# Patient Record
Sex: Male | Born: 1990
Health system: Southern US, Community
[De-identification: ages and names within clinical notes are randomized; demographics above are authoritative.]

## PROBLEM LIST (undated history)

## (undated) DIAGNOSIS — R51 Headache: Secondary | ICD-10-CM

## (undated) DIAGNOSIS — S069X9A Unspecified intracranial injury with loss of consciousness of unspecified duration, initial encounter: Secondary | ICD-10-CM

## (undated) DIAGNOSIS — S065X9A Traumatic subdural hemorrhage with loss of consciousness of unspecified duration, initial encounter: Secondary | ICD-10-CM

## (undated) DIAGNOSIS — S27329A Contusion of lung, unspecified, initial encounter: Secondary | ICD-10-CM

## (undated) HISTORY — DX: Contusion of lung, unspecified, initial encounter: S27.329A

## (undated) HISTORY — DX: Unspecified intracranial injury with loss of consciousness of unspecified duration, initial encounter: S06.9X9A

## (undated) HISTORY — DX: Traumatic subdural hemorrhage with loss of consciousness of unspecified duration, initial encounter: S06.5X9A

---
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
## 2011-04-09 LAB — BASIC METABOLIC PANEL
BUN: 13 mg/dL (ref 6–23)
Calcium: 10.4 mg/dL (ref 8.4–10.5)
Chloride: 102 mEq/L (ref 96–112)
Glucose, Bld: 95 mg/dL (ref 70–99)
Potassium: 4.7 mEq/L (ref 3.5–5.1)
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
## 2013-07-24 ENCOUNTER — Emergency Department (HOSPITAL_COMMUNITY)
Admission: EM | Admit: 2013-07-24 | Discharge: 2013-07-24 | Disposition: A | Discharge status: Home / Self Care | Payer: Self-pay | Attending: Emergency Medicine | Admitting: Emergency Medicine

## 2013-07-24 ENCOUNTER — Encounter (HOSPITAL_COMMUNITY): Payer: Self-pay | Admitting: Emergency Medicine

## 2013-07-24 DIAGNOSIS — K4090 Unilateral inguinal hernia, without obstruction or gangrene, not specified as recurrent: Secondary | ICD-10-CM | POA: Insufficient documentation

## 2013-07-24 DIAGNOSIS — F17200 Nicotine dependence, unspecified, uncomplicated: Secondary | ICD-10-CM | POA: Insufficient documentation

## 2013-07-24 NOTE — ED Provider Notes (Signed)
  CSN: [id number]     Arrival date & time 07/24/13  1311 History     First MD Initiated Contact with Patient 07/24/13 1451     Chief Complaint  Patient presents with  . Groin Pain   (Consider location/radiation/quality/duration/timing/severity/associated sxs/prior Treatment) Patient is a 22 y.o. male presenting with groin pain. The history is provided by the patient. No language interpreter was used.  Groin Pain This is a new problem. The current episode started 1 to 4 weeks ago. Associated symptoms include abdominal pain. Pertinent negatives include no chills, fever, nausea or vomiting. Associated symptoms comments: Swelling in left groin that is intermittent, usually associated with heavy lifting. It has been going on for the  last month. No dysuria, hematura, scrotal or testicular pain or swelling. Marland Kitchen    History reviewed. No pertinent past medical history. History reviewed. No pertinent past surgical history. History reviewed. No pertinent family history. History  Substance Use Topics  . Smoking status: Current Some Day Smoker  . Smokeless tobacco: Not on file  . Alcohol Use: Yes     Comment: occasion    Review of Systems  Constitutional: Negative for fever and chills.  Gastrointestinal: Positive for abdominal pain. Negative for nausea and vomiting.  Genitourinary: Negative for dysuria, discharge, scrotal swelling, difficulty urinating and testicular pain.  Musculoskeletal: Negative.   Skin: Negative.   Neurological: Negative.     Allergies  Review of patient's allergies indicates no known allergies.  Home Medications  No current outpatient prescriptions on file. BP 132/84  Pulse 62  Temp(Src) 98.4 F (36.9 C) (Oral)  Resp 18  SpO2 100% Physical Exam  Constitutional: He is oriented to person, place, and time. He appears well-developed and well-nourished.  HENT:  Head: Normocephalic.  Pulmonary/Chest: Effort normal.  Abdominal: Soft. There is no tenderness. There is  no rebound and no guarding.  Genitourinary:  Non-tender testicles, no scrotal swelling. Mass produced with intra-abdominal pressure in left inguinal area c/w hernia. Easily reducible. Minimally tender.   Neurological: He is alert and oriented to person, place, and time.  Skin: Skin is warm and dry.  Psychiatric: He has a normal mood and affect.    ED Course   Procedures (including critical care time)  Labs Reviewed - No data to display No results found. No diagnosis found. 1. Left inguinal hernia. MDM  Exam findings c/w easily reducible inguinal hernia. Will refer to surgery for elective repair. Discussed signs and symptoms that would prompt return to ED.  Arnoldo Hooker, PA-C 07/24/13 [phone]

## 2013-07-24 NOTE — ED Notes (Signed)
Patient rates pain in L Groin as 0/10 without movement.

## 2013-07-24 NOTE — ED Notes (Signed)
Pt reports pain in left groin for the last month or so. States was previously told he has a hernia in the same area. Pt reports in the last 2 weeks pain has increased. Pt denies nausea/vomiting. Reports some diarrhea. Pt alert, oriented x4, NAD at present.

## 2013-07-24 NOTE — ED Provider Notes (Signed)
Medical screening examination/treatment/procedure(s) were performed by non-physician practitioner and as supervising physician I was immediately available for consultation/collaboration.   Iori Gigante M Anevay Campanella, MD 07/24/13 1857 

## 2013-08-03 ENCOUNTER — Ambulatory Visit (INDEPENDENT_AMBULATORY_CARE_PROVIDER_SITE_OTHER): Payer: Self-pay | Admitting: Surgery

## 2013-08-17 ENCOUNTER — Telehealth (INDEPENDENT_AMBULATORY_CARE_PROVIDER_SITE_OTHER): Payer: Self-pay | Admitting: Surgery

## 2013-08-17 ENCOUNTER — Ambulatory Visit (INDEPENDENT_AMBULATORY_CARE_PROVIDER_SITE_OTHER): Payer: No Typology Code available for payment source | Admitting: Surgery

## 2013-08-17 ENCOUNTER — Encounter (INDEPENDENT_AMBULATORY_CARE_PROVIDER_SITE_OTHER): Payer: Self-pay | Admitting: Surgery

## 2013-08-17 VITALS — BP 116/66 | HR 64 | Temp 98.0°F | Resp 14 | Ht 71.0 in | Wt 188.0 lb

## 2013-08-17 DIAGNOSIS — K4090 Unilateral inguinal hernia, without obstruction or gangrene, not specified as recurrent: Secondary | ICD-10-CM

## 2013-08-17 DIAGNOSIS — K4020 Bilateral inguinal hernia, without obstruction or gangrene, not specified as recurrent: Secondary | ICD-10-CM | POA: Insufficient documentation

## 2013-08-17 NOTE — Telephone Encounter (Signed)
Pt made aware of financial obligation and orders active for 90 days. His insurance is monthly policy  Will call when able to  schedule

## 2013-08-17 NOTE — Patient Instructions (Addendum)
See the Handout(s) we gave you.  Consider surgery.  Please call our office at [phone] if you wish to schedule surgery or if you have further questions / concerns.   Hernia A hernia occurs when an internal organ pushes out through a weak spot in the abdominal wall. Hernias most commonly occur in the groin and around the navel. Hernias often can be pushed back into place (reduced). Most hernias tend to get worse over time. Some abdominal hernias can get stuck in the opening (irreducible or incarcerated hernia) and cannot be reduced. An irreducible abdominal hernia which is tightly squeezed into the opening is at risk for impaired blood supply (strangulated hernia). A strangulated hernia is a medical emergency. Because of the risk for an irreducible or strangulated hernia, surgery may be recommended to repair a hernia. CAUSES   Heavy lifting.  Prolonged coughing.  Straining to have a bowel movement.  A cut (incision) made during an abdominal surgery. HOME CARE INSTRUCTIONS   Bed rest is not required. You may continue your normal activities.  Avoid lifting more than 10 pounds (4.5 kg) or straining.  Cough gently. If you are a smoker it is best to stop. Even the best hernia repair can break down with the continual strain of coughing. Even if you do not have your hernia repaired, a cough will continue to aggravate the problem.  Do not wear anything tight over your hernia. Do not try to keep it in with an outside bandage or truss. These can damage abdominal contents if they are trapped within the hernia sac.  Eat a normal diet.  Avoid constipation. Straining over long periods of time will increase hernia size and encourage breakdown of repairs. If you cannot do this with diet alone, stool softeners may be used. SEEK IMMEDIATE MEDICAL CARE IF:   You have a fever.  You develop increasing abdominal pain.  You feel nauseous or vomit.  Your hernia is stuck outside the abdomen, looks  discolored, feels hard, or is tender.  You have any changes in your bowel habits or in the hernia that are unusual for you.  You have increased pain or swelling around the hernia.  You cannot push the hernia back in place by applying gentle pressure while lying down. MAKE SURE YOU:   Understand these instructions.  Will watch your condition.  Will get help right away if you are not doing well or get worse. Document Released: 12/10/2005 Document Revised: 03/03/2012 Document Reviewed: 07/29/2008 Exit[employer] Patient Information 2014 [employer].  HERNIA REPAIR: POST OP INSTRUCTIONS  1. DIET: Follow a light bland diet the first 24 hours after arrival home, such as soup, liquids, crackers, etc.  Be sure to include lots of fluids daily.  Avoid fast food or heavy meals as your are more likely to get nauseated.  Eat a low fat the next few days after surgery. 2. Take your usually prescribed home medications unless otherwise directed. 3. PAIN CONTROL: a. Pain is best controlled by a usual combination of three different methods TOGETHER: i. Ice/Heat ii. Over the counter pain medication iii. Prescription pain medication b. Most patients will experience some swelling and bruising around the hernia(s) such as the bellybutton, groins, or old incisions.  Ice packs or heating pads (30-60 minutes up to 6 times a day) will help. Use ice for the first few days to help decrease swelling and bruising, then switch to heat to help relax tight/sore spots and speed recovery.  Some people prefer to   use ice alone, heat alone, alternating between ice & heat.  Experiment to what works for you.  Swelling and bruising can take several weeks to resolve.   c. It is helpful to take an over-the-counter pain medication regularly for the first few weeks.  Choose one of the following that works best for you: i. Naproxen (Aleve, etc)  Two 220mg tabs twice a day ii. Ibuprofen (Advil, etc) Three 200mg tabs four times a day  (every meal & bedtime) iii. Acetaminophen (Tylenol, etc) 325-650mg four times a day (every meal & bedtime) d. A  prescription for pain medication should be given to you upon discharge.  Take your pain medication as prescribed.  i. If you are having problems/concerns with the prescription medicine (does not control pain, nausea, vomiting, rash, itching, etc), please call us [phone] to see if we need to switch you to a different pain medicine that will work better for you and/or control your side effect better. ii. If you need a refill on your pain medication, please contact your pharmacy.  They will contact our office to request authorization. Prescriptions will not be filled after 5 pm or on week-ends. 4. Avoid getting constipated.  Between the surgery and the pain medications, it is common to experience some constipation.  Increasing fluid intake and taking a fiber supplement (such as Metamucil, Citrucel, FiberCon, MiraLax, etc) 1-2 times a day regularly will usually help prevent this problem from occurring.  A mild laxative (prune juice, Milk of Magnesia, MiraLax, etc) should be taken according to package directions if there are no bowel movements after 48 hours.   5. Wash / shower every day.  You may shower over the dressings as they are waterproof.   6. Remove your waterproof bandages 5 days after surgery.  You may leave the incision open to air.  You may replace a dressing/Band-Aid to cover the incision for comfort if you wish.  Continue to shower over incision(s) after the dressing is off.    7. ACTIVITIES as tolerated:   a. You may resume regular (light) daily activities beginning the next day-such as daily self-care, walking, climbing stairs-gradually increasing activities as tolerated.  If you can walk 30 minutes without difficulty, it is safe to try more intense activity such as jogging, treadmill, bicycling, low-impact aerobics, swimming, etc. b. Save the most intensive and strenuous  activity for last such as sit-ups, heavy lifting, contact sports, etc  Refrain from any heavy lifting or straining until you are off narcotics for pain control.   c. DO NOT PUSH THROUGH PAIN.  Let pain be your guide: If it hurts to do something, don't do it.  Pain is your body warning you to avoid that activity for another week until the pain goes down. d. You may drive when you are no longer taking prescription pain medication, you can comfortably wear a seatbelt, and you can safely maneuver your car and apply brakes. e. You may have sexual intercourse when it is comfortable.  8. FOLLOW UP in our office a. Please call [facility] at [phone] to set up an appointment to see your surgeon in the office for a follow-up appointment approximately 2-3 weeks after your surgery. b. Make sure that you call for this appointment the day you arrive home to insure a convenient appointment time. 9.  IF YOU HAVE DISABILITY OR FAMILY LEAVE FORMS, BRING THEM TO THE OFFICE FOR PROCESSING.  DO NOT GIVE THEM TO YOUR DOCTOR.  WHEN TO CALL US (  [phone]: 1. Poor pain control 2. Reactions / problems with new medications (rash/itching, nausea, etc)  3. Fever over 101.5 F (38.5 C) 4. Inability to urinate 5. Nausea and/or vomiting 6. Worsening swelling or bruising 7. Continued bleeding from incision. 8. Increased pain, redness, or drainage from the incision   The clinic staff is available to answer your questions during regular business hours (8:30am-5pm).  Please don't hesitate to call and ask to speak to one of our nurses for clinical concerns.   If you have a medical emergency, go to the nearest emergency room or call 911.  A surgeon from [facility] is always on call at the hospitals in [place]  [facility] [address], [address], [place], [place]  [address] ?  [address] [address], [place], [place]   [address] MAIN: [phone] ? TOLL FREE: [phone] ? FAX: [phone] [email or web address]  

## 2013-08-17 NOTE — Progress Notes (Signed)
Subjective:     Patient ID: Shawn Mack, male   DOB: 1991/08/22, 22 y.o.   MRN: [id number]  HPI  Shawn Mack  05-Nov-1991 [id number]  Patient Care Team: No Pcp Per Patient as PCP - General (General Practice)  This patient is a 22 y.o.male who presents today for surgical evaluation at the request of Dr. Evern Bio ED MD.   Reason for visit: Left inguinal hernia  Pleasant young male.  Works as an Personnel officer.  Moderate activity with occasional lifting.  He has had intermittent lower abdominal groin pains for some time.  Workup has been negative and urgent Center and emergency room.  Became more focal the left groin.  Evaluation in emergency room earlier this month suspicious for inguinal hernia.  Therefore, sent to me for surgical evaluation.  Patient is very active.  He does not smoke.  No prior surgeries.  Has a daily bowel movement.  No history of skin infections.  The groin pain is worse with prolonged sitting or standing.  Also with increased activity.  He feels it affects his ability to work as he often has to decrease his activity level to avoid setting it off.  He is interested in fixing the hernia  Patient Active Problem List   Diagnosis Date Noted  . Inguinal hernia, left 08/17/2013    History reviewed. No pertinent past medical history.  History reviewed. No pertinent past surgical history.  History   Social History  . Marital Status: Unknown    Spouse Name: N/A    Number of Children: N/A  . Years of Education: N/A   Occupational History  . Not on file.   Social History Main Topics  . Smoking status: Current Every Day Smoker -- 0.25 packs/day  . Smokeless tobacco: Never Used  . Alcohol Use: Yes     Comment: occasion  . Drug Use: Yes    Special: Marijuana  . Sexual Activity: No   Other Topics Concern  . Not on file   Social History Narrative  . No narrative on file    History reviewed. No pertinent family history.  No current  outpatient prescriptions on file.   No current facility-administered medications for this visit.     No Known Allergies  BP 116/66  Pulse 64  Temp(Src) 98 F (36.7 C) (Temporal)  Resp 14  Ht 5\' 11"  (1.803 m)  Wt 188 lb (85.276 kg)  BMI 26.23 kg/m2  No results found.   Review of Systems  Constitutional: Negative for fever, chills and diaphoresis.  HENT: Negative for nosebleeds, sore throat, facial swelling, mouth sores, trouble swallowing and ear discharge.   Eyes: Negative for photophobia, discharge and visual disturbance.  Respiratory: Negative for choking, chest tightness, shortness of breath and stridor.   Cardiovascular: Negative for chest pain and palpitations.  Gastrointestinal: Negative for nausea, vomiting, abdominal pain, diarrhea, constipation, blood in stool, abdominal distention, anal bleeding and rectal pain.  Endocrine: Negative for cold intolerance and heat intolerance.  Genitourinary: Negative for dysuria, urgency, difficulty urinating and testicular pain.  Musculoskeletal: Positive for myalgias. Negative for back pain, arthralgias and gait problem.  Skin: Negative for color change, pallor, rash and wound.  Allergic/Immunologic: Negative for environmental allergies and food allergies.  Neurological: Negative for dizziness, speech difficulty, weakness, numbness and headaches.  Hematological: Negative for adenopathy. Does not bruise/bleed easily.  Psychiatric/Behavioral: Negative for hallucinations, confusion and agitation.       Objective:   Physical Exam  Constitutional: He is oriented  to person, place, and time. He appears well-developed and well-nourished. No distress.  HENT:  Head: Normocephalic.  Mouth/Throat: Oropharynx is clear and moist. No oropharyngeal exudate.  Eyes: Conjunctivae and EOM are normal. Pupils are equal, round, and reactive to light. No scleral icterus.  Neck: Normal range of motion. Neck supple. No tracheal deviation present.    Cardiovascular: Normal rate, regular rhythm and intact distal pulses.   Pulmonary/Chest: Effort normal and breath sounds normal. No respiratory distress.  Abdominal: Soft. He exhibits no distension. There is no tenderness. A hernia is present. Hernia confirmed positive in the left inguinal area. Hernia confirmed negative in the ventral area and confirmed negative in the right inguinal area.    Musculoskeletal: Normal range of motion. He exhibits no tenderness.  Lymphadenopathy:    He has no cervical adenopathy.       Right: No inguinal adenopathy present.       Left: No inguinal adenopathy present.  Neurological: He is alert and oriented to person, place, and time. No cranial nerve deficit. He exhibits normal muscle tone. Coordination normal.  Skin: Skin is warm and dry. No rash noted. He is not diaphoretic. No erythema. No pallor.  Psychiatric: He has a normal mood and affect. His behavior is normal. Judgment and thought content normal.       Assessment:     Left inguinal hernia in an active male    Plan:     Lap repair:  The anatomy & physiology of the abdominal wall and pelvic floor was discussed.  The pathophysiology of hernias in the inguinal and pelvic region was discussed.  Natural history risks such as progressive enlargement, pain, incarceration & strangulation was discussed.   Contributors to complications such as smoking, obesity, diabetes, prior surgery, etc were discussed.    I feel the risks of no intervention will lead to serious problems that outweigh the operative risks; therefore, I recommended surgery to reduce and repair the hernia.  I explained laparoscopic techniques with possible need for an open approach.  I noted usual use of mesh to patch and/or buttress hernia repair  Risks such as bleeding, infection, abscess, need for further treatment, heart attack, death, and other risks were discussed.  I noted a good likelihood this will help address the problem.   Goals  of post-operative recovery were discussed as well.  Possibility that this will not correct all symptoms was explained.  I stressed the importance of low-impact activity, aggressive pain control, avoiding constipation, & not pushing through pain to minimize risk of post-operative chronic pain or injury. Possibility of reherniation was discussed.  We will work to minimize complications.     An educational handout further explaining the pathology & treatment options was given as well.  Questions were answered.  The patient expresses understanding & wishes to proceed with surgery.

## 2013-08-27 ENCOUNTER — Encounter (HOSPITAL_COMMUNITY): Payer: Self-pay

## 2013-08-27 ENCOUNTER — Encounter (HOSPITAL_COMMUNITY)
Admission: RE | Admit: 2013-08-27 | Discharge: 2013-08-27 | Disposition: A | Discharge status: Home / Self Care | Payer: No Typology Code available for payment source | Source: Ambulatory Visit | Attending: Surgery | Admitting: Surgery

## 2013-08-27 DIAGNOSIS — Z01818 Encounter for other preprocedural examination: Secondary | ICD-10-CM | POA: Insufficient documentation

## 2013-08-27 DIAGNOSIS — Z01812 Encounter for preprocedural laboratory examination: Secondary | ICD-10-CM | POA: Insufficient documentation

## 2013-08-27 HISTORY — DX: Headache: R51

## 2013-08-27 LAB — CBC
HCT: 47 % (ref 39.0–52.0)
MCHC: 35.3 g/dL (ref 30.0–36.0)
MCV: 91.3 fL (ref 78.0–100.0)
Platelets: 183 10*3/uL (ref 150–400)
RBC: 5.15 MIL/uL (ref 4.22–5.81)
RDW: 13 % (ref 11.5–15.5)

## 2013-08-27 NOTE — Progress Notes (Signed)
Pt denies SOB, chest pain, and being under the care of a cardiologist. Pt denies having an EKG and chest x ray within the last year. Pt denies having a stress test, echo, and cardiac cath.  

## 2013-08-27 NOTE — Pre-Procedure Instructions (Signed)
Shawn Mack  08/27/2013   Your procedure is scheduled on: Tuesday, September 01, 2013  Report to [facility] at 8:00 AM.  Call this number if you have problems the morning of surgery: [phone]   Remember:   Do not eat food or drink liquids after midnight.   Take these medicines the morning of surgery with A SIP OF WATER: none Stop taking Aspirin and herbal medications. Do not take any NSAIDs ie: Ibuprofen, Advil, Naproxen or any medication containing Aspirin.  Do not wear jewelry, make-up or nail polish.  Do not wear lotions, powders, or perfumes. You may wear deodorant.  Do not shave 48 hours prior to surgery. Men may shave face and neck.  Do not bring valuables to the hospital.  [facility] is not responsible for any belongings or valuables.  Contacts, dentures or bridgework may not be worn into surgery.  Leave suitcase in the car. After surgery it may be brought to your room.  For patients admitted to the hospital, checkout time is 11:00 AM the day of discharge.   Patients discharged the day of surgery will not be allowed to drive home.  Name and phone number of your driver:  Special Instructions: Shower using CHG 2 nights before surgery and the night before surgery.  If you shower the day of surgery use CHG.  Use special wash - you have one bottle of CHG for all showers.  You should use approximately 1/3 of the bottle for each shower.   Please read over the following fact sheets that you were given: Pain Booklet, Coughing and Deep Breathing and Surgical Site Infection Prevention

## 2013-08-31 MED ORDER — CEFAZOLIN SODIUM-DEXTROSE 2-3 GM-% IV SOLR
2.0000 g | INTRAVENOUS | Status: DC
  Filled 2013-08-31: qty 50

## 2013-09-01 ENCOUNTER — Ambulatory Visit (HOSPITAL_COMMUNITY): Payer: No Typology Code available for payment source | Admitting: Anesthesiology

## 2013-09-01 ENCOUNTER — Encounter (HOSPITAL_COMMUNITY): Payer: Self-pay | Admitting: Anesthesiology

## 2013-09-01 ENCOUNTER — Ambulatory Visit (HOSPITAL_COMMUNITY)
Admission: RE | Admit: 2013-09-01 | Discharge: 2013-09-01 | Disposition: A | Discharge status: Home / Self Care | Payer: No Typology Code available for payment source | Source: Ambulatory Visit | Attending: Surgery | Admitting: Surgery

## 2013-09-01 ENCOUNTER — Encounter (HOSPITAL_COMMUNITY): Payer: Self-pay | Admitting: *Deleted

## 2013-09-01 ENCOUNTER — Encounter (HOSPITAL_COMMUNITY)
Admission: RE | Disposition: A | Discharge status: Home / Self Care | Payer: Self-pay | Source: Ambulatory Visit | Attending: Surgery

## 2013-09-01 DIAGNOSIS — F1210 Cannabis abuse, uncomplicated: Secondary | ICD-10-CM | POA: Insufficient documentation

## 2013-09-01 DIAGNOSIS — F17200 Nicotine dependence, unspecified, uncomplicated: Secondary | ICD-10-CM | POA: Insufficient documentation

## 2013-09-01 DIAGNOSIS — K4020 Bilateral inguinal hernia, without obstruction or gangrene, not specified as recurrent: Secondary | ICD-10-CM

## 2013-09-01 DIAGNOSIS — D176 Benign lipomatous neoplasm of spermatic cord: Secondary | ICD-10-CM | POA: Insufficient documentation

## 2013-09-01 DIAGNOSIS — K4090 Unilateral inguinal hernia, without obstruction or gangrene, not specified as recurrent: Secondary | ICD-10-CM

## 2013-09-01 HISTORY — PX: INGUINAL HERNIA REPAIR: SHX194

## 2013-09-01 HISTORY — PX: INSERTION OF MESH: SHX5868

## 2013-09-01 SURGERY — REPAIR, HERNIA, INGUINAL, LAPAROSCOPIC
Anesthesia: General | Site: Groin | Wound class: Clean

## 2013-09-01 MED ORDER — BUPIVACAINE-EPINEPHRINE PF 0.25-1:200000 % IJ SOLN
INTRAMUSCULAR | Status: AC
  Filled 2013-09-01: qty 30

## 2013-09-01 MED ORDER — DEXAMETHASONE SODIUM PHOSPHATE 4 MG/ML IJ SOLN
INTRAMUSCULAR | Status: DC | PRN
  Administered 2013-09-01: 4 mg via INTRAVENOUS

## 2013-09-01 MED ORDER — METOCLOPRAMIDE HCL 5 MG/ML IJ SOLN
INTRAMUSCULAR | Status: AC
  Administered 2013-09-01: 10 mg
  Filled 2013-09-01: qty 2

## 2013-09-01 MED ORDER — ONDANSETRON HCL 4 MG/2ML IJ SOLN
4.0000 mg | Freq: Once | INTRAMUSCULAR | Status: AC | PRN
  Administered 2013-09-01: 4 mg via INTRAVENOUS

## 2013-09-01 MED ORDER — PROPOFOL 10 MG/ML IV BOLUS
INTRAVENOUS | Status: DC | PRN
  Administered 2013-09-01: 300 mg via INTRAVENOUS
  Administered 2013-09-01: 50 mg via INTRAVENOUS

## 2013-09-01 MED ORDER — 0.9 % SODIUM CHLORIDE (POUR BTL) OPTIME
TOPICAL | Status: DC | PRN
  Administered 2013-09-01: 2000 mL

## 2013-09-01 MED ORDER — ROCURONIUM BROMIDE 100 MG/10ML IV SOLN
INTRAVENOUS | Status: DC | PRN
  Administered 2013-09-01: 10 mg via INTRAVENOUS
  Administered 2013-09-01: 40 mg via INTRAVENOUS
  Administered 2013-09-01: 10 mg via INTRAVENOUS

## 2013-09-01 MED ORDER — FENTANYL CITRATE 0.05 MG/ML IJ SOLN
INTRAMUSCULAR | Status: DC | PRN
  Administered 2013-09-01: 50 ug via INTRAVENOUS
  Administered 2013-09-01: 150 ug via INTRAVENOUS
  Administered 2013-09-01 (×3): 50 ug via INTRAVENOUS

## 2013-09-01 MED ORDER — CHLORHEXIDINE GLUCONATE 4 % EX LIQD: 1.0000 "application " | Freq: Once | CUTANEOUS | Status: DC

## 2013-09-01 MED ORDER — NEOSTIGMINE METHYLSULFATE 1 MG/ML IJ SOLN
INTRAMUSCULAR | Status: DC | PRN
  Administered 2013-09-01: 5 mg via INTRAVENOUS

## 2013-09-01 MED ORDER — LACTATED RINGERS IV SOLN: INTRAVENOUS | Status: DC

## 2013-09-01 MED ORDER — HYDROMORPHONE HCL PF 1 MG/ML IJ SOLN
INTRAMUSCULAR | Status: AC
  Filled 2013-09-01: qty 1

## 2013-09-01 MED ORDER — BUPIVACAINE-EPINEPHRINE 0.25% -1:200000 IJ SOLN
INTRAMUSCULAR | Status: DC | PRN
  Administered 2013-09-01: 50 mL

## 2013-09-01 MED ORDER — KETOROLAC TROMETHAMINE 30 MG/ML IJ SOLN
INTRAMUSCULAR | Status: DC | PRN
  Administered 2013-09-01 (×2): 15 mg via INTRAVENOUS

## 2013-09-01 MED ORDER — MIDAZOLAM HCL 5 MG/5ML IJ SOLN
INTRAMUSCULAR | Status: DC | PRN
  Administered 2013-09-01: 2 mg via INTRAVENOUS

## 2013-09-01 MED ORDER — OXYCODONE HCL 5 MG PO TABS: 5.0000 mg | ORAL_TABLET | ORAL | Status: DC | PRN

## 2013-09-01 MED ORDER — LIDOCAINE HCL (CARDIAC) 20 MG/ML IV SOLN
INTRAVENOUS | Status: DC | PRN
  Administered 2013-09-01: 100 mg via INTRATRACHEAL
  Administered 2013-09-01: 70 mg via INTRAVENOUS

## 2013-09-01 MED ORDER — ONDANSETRON HCL 4 MG/2ML IJ SOLN
INTRAMUSCULAR | Status: DC | PRN
  Administered 2013-09-01: 4 mg via INTRAVENOUS

## 2013-09-01 MED ORDER — ONDANSETRON HCL 4 MG/2ML IJ SOLN
INTRAMUSCULAR | Status: AC
  Filled 2013-09-01: qty 2

## 2013-09-01 MED ORDER — NAPROXEN 500 MG PO TABS: 500.0000 mg | ORAL_TABLET | Freq: Two times a day (BID) | ORAL | Status: DC

## 2013-09-01 MED ORDER — HYDROMORPHONE HCL PF 1 MG/ML IJ SOLN
0.2500 mg | INTRAMUSCULAR | Status: DC | PRN
Start: 2013-09-01 — End: 2013-09-01
  Administered 2013-09-01 (×2): 0.5 mg via INTRAVENOUS

## 2013-09-01 MED ORDER — LACTATED RINGERS IV SOLN
INTRAVENOUS | Status: DC | PRN
  Administered 2013-09-01 (×3): via INTRAVENOUS

## 2013-09-01 MED ORDER — GLYCOPYRROLATE 0.2 MG/ML IJ SOLN
INTRAMUSCULAR | Status: DC | PRN
  Administered 2013-09-01: 0.6 mg via INTRAVENOUS

## 2013-09-01 SURGICAL SUPPLY — 39 items
APPLIER CLIP 5 13 M/L LIGAMAX5 (MISCELLANEOUS)
APR CLP MED LRG 5 ANG JAW (MISCELLANEOUS)
CANISTER SUCTION 2500CC (MISCELLANEOUS) IMPLANT
CHLORAPREP W/TINT 26ML (MISCELLANEOUS) ×3 IMPLANT
CLIP APPLIE 5 13 M/L LIGAMAX5 (MISCELLANEOUS) IMPLANT
CLOTH BEACON ORANGE TIMEOUT ST (SAFETY) ×3 IMPLANT
COVER SURGICAL LIGHT HANDLE (MISCELLANEOUS) ×3 IMPLANT
DRAPE WARM FLUID 44X44 (DRAPE) ×3 IMPLANT
DRSG TEGADERM 4X4.75 (GAUZE/BANDAGES/DRESSINGS) ×3 IMPLANT
ELECT REM PT RETURN 9FT ADLT (ELECTROSURGICAL) ×3
ELECTRODE REM PT RTRN 9FT ADLT (ELECTROSURGICAL) ×2 IMPLANT
GAUZE SPONGE 2X2 8PLY STRL LF (GAUZE/BANDAGES/DRESSINGS) ×2 IMPLANT
GLOVE BIOGEL PI IND STRL 6.5 (GLOVE) IMPLANT
GLOVE BIOGEL PI IND STRL 8 (GLOVE) ×2 IMPLANT
GLOVE BIOGEL PI INDICATOR 6.5 (GLOVE) ×1
GLOVE BIOGEL PI INDICATOR 8 (GLOVE) ×1
GLOVE ECLIPSE 8.0 STRL XLNG CF (GLOVE) ×3 IMPLANT
GLOVE SURG SS PI 6.0 STRL IVOR (GLOVE) ×1 IMPLANT
GOWN STRL NON-REIN LRG LVL3 (GOWN DISPOSABLE) ×6 IMPLANT
GOWN STRL REIN XL XLG (GOWN DISPOSABLE) ×3 IMPLANT
KIT BASIN OR (CUSTOM PROCEDURE TRAY) ×3 IMPLANT
KIT ROOM TURNOVER OR (KITS) ×3 IMPLANT
MESH ULTRAPRO 6X6 15CM15CM (Mesh General) ×2 IMPLANT
NEEDLE 22X1 1/2 (OR ONLY) (NEEDLE) ×3 IMPLANT
NS IRRIG 1000ML POUR BTL (IV SOLUTION) ×3 IMPLANT
PAD ARMBOARD 7.5X6 YLW CONV (MISCELLANEOUS) ×6 IMPLANT
SCISSORS LAP 5X35 DISP (ENDOMECHANICALS) IMPLANT
SET IRRIG TUBING LAPAROSCOPIC (IRRIGATION / IRRIGATOR) IMPLANT
SLEEVE ENDOPATH XCEL 5M (ENDOMECHANICALS) ×3 IMPLANT
SPONGE GAUZE 2X2 STER 10/PKG (GAUZE/BANDAGES/DRESSINGS) ×1
SUT MNCRL AB 4-0 PS2 18 (SUTURE) ×3 IMPLANT
SUT VIC AB 3-0 SH 27 (SUTURE) ×6
SUT VIC AB 3-0 SH 27XBRD (SUTURE) IMPLANT
TOWEL OR 17X24 6PK STRL BLUE (TOWEL DISPOSABLE) ×3 IMPLANT
TOWEL OR 17X26 10 PK STRL BLUE (TOWEL DISPOSABLE) ×3 IMPLANT
TRAY FOLEY CATH 16FR SILVER (SET/KITS/TRAYS/PACK) IMPLANT
TRAY LAPAROSCOPIC (CUSTOM PROCEDURE TRAY) ×3 IMPLANT
TROCAR XCEL BLUNT TIP 100MML (ENDOMECHANICALS) ×3 IMPLANT
TROCAR XCEL NON-BLD 5MMX100MML (ENDOMECHANICALS) ×3 IMPLANT

## 2013-09-01 NOTE — Interval H&P Note (Signed)
History and Physical Interval Note:  09/01/2013 9:45 AM  Shawn Mack  has presented today for surgery, with the diagnosis of left ingunial hernia  The various methods of treatment have been discussed with the patient and family. After consideration of risks, benefits and other options for treatment, the patient has consented to  Procedure(s): LAPAROSCOPIC EXPLORATION & REPAIR OF HERNIA IN LEFT GROIN (Left) INSERTION OF MESH (N/A) as a surgical intervention .  The patient's history has been reviewed, patient examined, no change in status, stable for surgery.  I have reviewed the patient's chart and labs.  Questions were answered to the patient's satisfaction.     Danille Oppedisano C.

## 2013-09-01 NOTE — Anesthesia Postprocedure Evaluation (Signed)
  Anesthesia Post-op Note  Patient: Shawn Mack  Procedure(s) Performed: Procedure(s): LAPAROSCOPIC BILATERAL INGUINAL HERNIA REPAIR  (Bilateral) INSERTION OF MESH (N/A)  Patient Location: PACU  Anesthesia Type:General  Level of Consciousness: awake, alert , oriented and patient cooperative  Airway and Oxygen Therapy: Patient Spontanous Breathing  Post-op Pain: mild  Post-op Assessment: Post-op Vital signs reviewed, Patient's Cardiovascular Status Stable, Respiratory Function Stable, Patent Airway, No signs of Nausea or vomiting and Pain level controlled  Post-op Vital Signs: stable  Complications: No apparent anesthesia complications

## 2013-09-01 NOTE — H&P (View-Only) (Signed)
Subjective:     Patient ID: Shawn Mack, male   DOB: 04/28/1991, 22 y.o.   MRN: [id number]  HPI  Shawn Mack  08/12/1991 [id number]  Patient Care Team: No Pcp Per Patient as PCP - General (General Practice)  This patient is a 22 y.o.male who presents today for surgical evaluation at the request of Dr. Bednar, [facility] MD.   Reason for visit: Left inguinal hernia  Pleasant young male.  Works as an electrician.  Moderate activity with occasional lifting.  He has had intermittent lower abdominal groin pains for some time.  Workup has been negative and urgent Center and emergency room.  Became more focal the left groin.  Evaluation in emergency room earlier this month suspicious for inguinal hernia.  Therefore, sent to me for surgical evaluation.  Patient is very active.  He does not smoke.  No prior surgeries.  Has a daily bowel movement.  No history of skin infections.  The groin pain is worse with prolonged sitting or standing.  Also with increased activity.  He feels it affects his ability to work as he often has to decrease his activity level to avoid setting it off.  He is interested in fixing the hernia  Patient Active Problem List   Diagnosis Date Noted  . Inguinal hernia, left 08/17/2013    History reviewed. No pertinent past medical history.  History reviewed. No pertinent past surgical history.  History   Social History  . Marital Status: Unknown    Spouse Name: N/A    Number of Children: N/A  . Years of Education: N/A   Occupational History  . Not on file.   Social History Main Topics  . Smoking status: Current Every Day Smoker -- 0.25 packs/day  . Smokeless tobacco: Never Used  . Alcohol Use: Yes     Comment: occasion  . Drug Use: Yes    Special: Marijuana  . Sexual Activity: No   Other Topics Concern  . Not on file   Social History Narrative  . No narrative on file    History reviewed. No pertinent family history.  No current  outpatient prescriptions on file.   No current facility-administered medications for this visit.     No Known Allergies  BP 116/66  Pulse 64  Temp(Src) 98 F (36.7 C) (Temporal)  Resp 14  Ht 5' 11" (1.803 m)  Wt 188 lb (85.276 kg)  BMI 26.23 kg/m2  No results found.   Review of Systems  Constitutional: Negative for fever, chills and diaphoresis.  HENT: Negative for nosebleeds, sore throat, facial swelling, mouth sores, trouble swallowing and ear discharge.   Eyes: Negative for photophobia, discharge and visual disturbance.  Respiratory: Negative for choking, chest tightness, shortness of breath and stridor.   Cardiovascular: Negative for chest pain and palpitations.  Gastrointestinal: Negative for nausea, vomiting, abdominal pain, diarrhea, constipation, blood in stool, abdominal distention, anal bleeding and rectal pain.  Endocrine: Negative for cold intolerance and heat intolerance.  Genitourinary: Negative for dysuria, urgency, difficulty urinating and testicular pain.  Musculoskeletal: Positive for myalgias. Negative for back pain, arthralgias and gait problem.  Skin: Negative for color change, pallor, rash and wound.  Allergic/Immunologic: Negative for environmental allergies and food allergies.  Neurological: Negative for dizziness, speech difficulty, weakness, numbness and headaches.  Hematological: Negative for adenopathy. Does not bruise/bleed easily.  Psychiatric/Behavioral: Negative for hallucinations, confusion and agitation.       Objective:   Physical Exam  Constitutional: He is oriented   to person, place, and time. He appears well-developed and well-nourished. No distress.  HENT:  Head: Normocephalic.  Mouth/Throat: Oropharynx is clear and moist. No oropharyngeal exudate.  Eyes: Conjunctivae and EOM are normal. Pupils are equal, round, and reactive to light. No scleral icterus.  Neck: Normal range of motion. Neck supple. No tracheal deviation present.    Cardiovascular: Normal rate, regular rhythm and intact distal pulses.   Pulmonary/Chest: Effort normal and breath sounds normal. No respiratory distress.  Abdominal: Soft. He exhibits no distension. There is no tenderness. A hernia is present. Hernia confirmed positive in the left inguinal area. Hernia confirmed negative in the ventral area and confirmed negative in the right inguinal area.    Musculoskeletal: Normal range of motion. He exhibits no tenderness.  Lymphadenopathy:    He has no cervical adenopathy.       Right: No inguinal adenopathy present.       Left: No inguinal adenopathy present.  Neurological: He is alert and oriented to person, place, and time. No cranial nerve deficit. He exhibits normal muscle tone. Coordination normal.  Skin: Skin is warm and dry. No rash noted. He is not diaphoretic. No erythema. No pallor.  Psychiatric: He has a normal mood and affect. His behavior is normal. Judgment and thought content normal.       Assessment:     Left inguinal hernia in an active male    Plan:     Lap repair:  The anatomy & physiology of the abdominal wall and pelvic floor was discussed.  The pathophysiology of hernias in the inguinal and pelvic region was discussed.  Natural history risks such as progressive enlargement, pain, incarceration & strangulation was discussed.   Contributors to complications such as smoking, obesity, diabetes, prior surgery, etc were discussed.    I feel the risks of no intervention will lead to serious problems that outweigh the operative risks; therefore, I recommended surgery to reduce and repair the hernia.  I explained laparoscopic techniques with possible need for an open approach.  I noted usual use of mesh to patch and/or buttress hernia repair  Risks such as bleeding, infection, abscess, need for further treatment, heart attack, death, and other risks were discussed.  I noted a good likelihood this will help address the problem.   Goals  of post-operative recovery were discussed as well.  Possibility that this will not correct all symptoms was explained.  I stressed the importance of low-impact activity, aggressive pain control, avoiding constipation, & not pushing through pain to minimize risk of post-operative chronic pain or injury. Possibility of reherniation was discussed.  We will work to minimize complications.     An educational handout further explaining the pathology & treatment options was given as well.  Questions were answered.  The patient expresses understanding & wishes to proceed with surgery.         

## 2013-09-01 NOTE — Anesthesia Procedure Notes (Signed)
Procedure Name: Intubation Date/Time: 09/01/2013 10:26 AM Performed by: Lovie Chol Pre-anesthesia Checklist: Patient identified, Emergency Drugs available, Suction available, Patient being monitored and Timeout performed Patient Re-evaluated:Patient Re-evaluated prior to inductionOxygen Delivery Method: Circle system utilized Preoxygenation: Pre-oxygenation with 100% oxygen Intubation Type: IV induction Ventilation: Mask ventilation without difficulty and Oral airway inserted - appropriate to patient size Laryngoscope Size: Miller and 2 Grade View: Grade I Tube type: Oral Tube size: 7.5 mm Number of attempts: 1 Airway Equipment and Method: Stylet Placement Confirmation: ETT inserted through vocal cords under direct vision,  positive ETCO2,  CO2 detector and breath sounds checked- equal and bilateral Secured at: 22 cm Tube secured with: Tape Dental Injury: Teeth and Oropharynx as per pre-operative assessment

## 2013-09-01 NOTE — Op Note (Signed)
09/01/2013  11:54 AM  PATIENT:  Shawn Mack  22 y.o. male  Patient Care Team: No Pcp Per Patient as PCP - General (General Practice)  PRE-OPERATIVE DIAGNOSIS:  left ingunial hernia  POST-OPERATIVE DIAGNOSIS:  Bilateral ingunial herniae  PROCEDURE:  Procedure(s): LAPAROSCOPIC BILATERAL INGUINAL HERNIA REPAIR  INSERTION OF MESH  SURGEON:  Surgeon(s): Ardeth Sportsman, MD  ANESTHESIA:   local and general  EBL:  Total I/O In: 1500 [I.V.:1500] Out: -   Delay start of Pharmacological VTE agent (>24hrs) due to surgical blood loss or risk of bleeding:  no  DRAINS: none   SPECIMEN:  No Specimen  DISPOSITION OF SPECIMEN:  N/A  COUNTS:  YES  PLAN OF CARE: Discharge to home after PACU  PATIENT DISPOSITION:  PACU - hemodynamically stable.  INDICATION: Pleasant young active male.  Developed obvious bulge in left side suspicious for left inguinal hernia.  I recommend laparoscopic exploration and repair of hernias  The anatomy & physiology of the abdominal wall and pelvic floor was discussed.  The pathophysiology of hernias in the inguinal and pelvic region was discussed.  Natural history risks such as progressive enlargement, pain, incarceration & strangulation was discussed.   Contributors to complications such as smoking, obesity, diabetes, prior surgery, etc were discussed.    I feel the risks of no intervention will lead to serious problems that outweigh the operative risks; therefore, I recommended surgery to reduce and repair the hernia.  I explained laparoscopic techniques with possible need for an open approach.  I noted usual use of mesh to patch and/or buttress hernia repair  Risks such as bleeding, infection, abscess, need for further treatment, heart attack, death, and other risks were discussed.  I noted a good likelihood this will help address the problem.   Goals of post-operative recovery were discussed as well.  Possibility that this will not correct all symptoms  was explained.  I stressed the importance of low-impact activity, aggressive pain control, avoiding constipation, & not pushing through pain to minimize risk of post-operative chronic pain or injury. Possibility of reherniation was discussed.  We will work to minimize complications.     An educational handout further explaining the pathology & treatment options was given as well.  Questions were answered.  The patient expresses understanding & wishes to proceed with surgery.  OR FINDINGS: Left > right Indirect inguinal hernias.  Small spermatic cord lipomas.  No evidence of obturator or femoral hernias.  No direct hernias  DESCRIPTION:   The patient was identified & brought into the operating room. The patient was positioned supine with arms tucked. SCDs were active during the entire case. The patient underwent general anesthesia without any difficulty.  The abdomen was prepped and draped in a sterile fashion. The patient's bladder was emptied.  A Surgical Timeout confirmed our plan.  I made a transverse incision through the inferior umbilical fold.  I made a small transverse nick through the anterior rectus fascia contralateral to the inguinal hernia side and placed a 0-vicryl stitch through the fascia.  I placed a Hasson trocar into the preperitoneal plane.  Entry was clean.  We induced carbon dioxide insufflation. Camera inspection revealed no injury.  I used a 10mm angled scope to bluntly free the peritoneum off the infraumbilical anterior abdominal wall.  I created enough of a preperitoneal pocket to place 5mm ports into the right & left mid-abdomen into this preperitoneal cavity.  I focused attention on the left side since that was the obvious  hernia side.   I used blunt & focused sharp dissection to free the peritoneum off the flank and down to the pubic rim.  I freed the anteriolateral bladder wall off the anteriolateral pelvic wall, sparing midline attachments.   I located a swath of peritoneum  going into a hernia fascial defect at the internal ring consistent with an indirect inguinal hernia.  I gradually freed the peritoneal hernia sac off safely and reduced it into the preperitoneal space.  I freed the peritoneum off the spermatic vessels & vas deferens.  I freed peritoneum off the retroperitoneum along the psoas muscle.    I checked & assured hemostasis.    I turned attention on the opposite side.  He had a dilated internal ring on the right side suspicious for an early indirect inguinal hernia   I did dissection in a similar, mirror-image fashion.  I confirmed that the patient did have an indirect inguinal hernia on the right side.      In freeing off the hernia sacs I did have a breech in the peritoneum at the hernia sacs.  I closed that using absorbable Vicryl stitch using laparoscopic intracorporeal suturing, also providing a high ligation of the hernia sac.  I chose 15x15 cm sheets of ultra-lightweight polypropylene mesh (Ultrapro), one for each side.  I cut a single sigmoid-shaped slit ~6cm from a corner of each mesh.  I placed the meshes into the preperitoneal space & laid them as overlapping diamonds such that at the inferior points, a 6x6 cm corner flap rested in the true anterolateral pelvis, covering the obturator & femoral foramina.   I allowed the bladder to fall back and help tuck the corners of the mesh in.  The medial corners overlapped each other across midline cephalad to the pubic rim.   This provided >2 inch coverage around the hernia.  Because the defects well covered and not particularly large, I did not need tacks to hold the mesh in place  I held the hernia sacs cephalad & evacuated carbon dioxide.  I closed the fascia  With absorbable suture.  I closed the skin using 4-0 monocryl stitch.  Sterile dressings were applied. The patient was extubated & arrived in the PACU in stable condition..  I had discussed postoperative care with the patient and his father in the  holding area.   I am about to discuss operative findings and postoperative goals / instructions to the father again.  Instructions are written in the chart.  Prescriptions have been filled.

## 2013-09-01 NOTE — Transfer of Care (Signed)
Immediate Anesthesia Transfer of Care Note  Patient: Shawn Mack  Procedure(s) Performed: Procedure(s): LAPAROSCOPIC BILATERAL INGUINAL HERNIA REPAIR  (Bilateral) INSERTION OF MESH (N/A)  Patient Location: PACU  Anesthesia Type:General  Level of Consciousness: sedated and patient cooperative  Airway & Oxygen Therapy: Patient Spontanous Breathing and Patient connected to nasal cannula oxygen  Post-op Assessment: Report given to PACU RN and Post -op Vital signs reviewed and stable  Post vital signs: Reviewed and stable  Complications: No apparent anesthesia complications

## 2013-09-01 NOTE — Preoperative (Signed)
Beta Blockers   Reason not to administer Beta Blockers:Not Applicable 

## 2013-09-01 NOTE — Anesthesia Preprocedure Evaluation (Signed)
Anesthesia Evaluation  Patient identified by MRN, date of birth, ID band Patient awake    Reviewed: Allergy & Precautions, H&P , NPO status , Patient's Chart, lab work & pertinent test results  Airway       Dental   Pulmonary Current Smoker,          Cardiovascular     Neuro/Psych  Headaches,    GI/Hepatic   Endo/Other    Renal/GU      Musculoskeletal   Abdominal   Peds  Hematology   Anesthesia Other Findings   Reproductive/Obstetrics                           Anesthesia Physical Anesthesia Plan  ASA: I  Anesthesia Plan: General   Post-op Pain Management:    Induction: Intravenous  Airway Management Planned: Oral ETT  Additional Equipment:   Intra-op Plan:   Post-operative Plan: Extubation in OR  Informed Consent: I have reviewed the patients History and Physical, chart, labs and discussed the procedure including the risks, benefits and alternatives for the proposed anesthesia with the patient or authorized representative who has indicated his/her understanding and acceptance.     Plan Discussed with: CRNA  Anesthesia Plan Comments:         Anesthesia Quick Evaluation

## 2013-09-04 ENCOUNTER — Encounter (HOSPITAL_COMMUNITY): Payer: Self-pay | Admitting: Surgery

## 2013-09-04 ENCOUNTER — Telehealth (INDEPENDENT_AMBULATORY_CARE_PROVIDER_SITE_OTHER): Payer: Self-pay

## 2013-09-04 NOTE — Telephone Encounter (Signed)
Pt calling in b/c he has not had a BM in 3 days since surgery on 09/01/13 lap hernia. The pt is passing gas and not having any pain just doesn't want it to go any further without a BM. The pt has been taking a stool softner along with his pain medicine but no relief yet. I advised pt that he needed to be on a light food diet until he gets his bowels under better control. I advised pt per protocol that the pt may take Milk of Magnesia 4 tbls to get bowels moving but the pt has castor oil he would like to try. I advised him that would be up to him about doing the castor oil but he did need some type of laxative to get his bowels moving. The pt understands.

## 2013-09-15 ENCOUNTER — Encounter (INDEPENDENT_AMBULATORY_CARE_PROVIDER_SITE_OTHER): Payer: Self-pay | Admitting: Surgery

## 2013-09-15 ENCOUNTER — Ambulatory Visit (INDEPENDENT_AMBULATORY_CARE_PROVIDER_SITE_OTHER): Payer: No Typology Code available for payment source | Admitting: Surgery

## 2013-09-15 ENCOUNTER — Encounter (INDEPENDENT_AMBULATORY_CARE_PROVIDER_SITE_OTHER): Payer: Self-pay

## 2013-09-15 VITALS — BP 118/76 | HR 64 | Temp 98.0°F | Resp 14 | Ht 71.5 in | Wt 189.2 lb

## 2013-09-15 DIAGNOSIS — K4090 Unilateral inguinal hernia, without obstruction or gangrene, not specified as recurrent: Secondary | ICD-10-CM
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
## 2013-09-15 NOTE — Patient Instructions (Addendum)
HERNIA REPAIR: POST OP INSTRUCTIONS  1. DIET: Follow a light bland diet the first 24 hours after arrival home, such as soup, liquids, crackers, etc.  Be sure to include lots of fluids daily.  Avoid fast food or heavy meals as your are more likely to get nauseated.  Eat a low fat the next few days after surgery. 2. Take your usually prescribed home medications unless otherwise directed. 3. PAIN CONTROL: a. Pain is best controlled by a usual combination of three different methods TOGETHER: i. Ice/Heat ii. Over the counter pain medication iii. Prescription pain medication b. Most patients will experience some swelling and bruising around the hernia(s) such as the bellybutton, groins, or old incisions.  Ice packs or heating pads (30-60 minutes up to 6 times a day) will help. Use ice for the first few days to help decrease swelling and bruising, then switch to heat to help relax tight/sore spots and speed recovery.  Some people prefer to use ice alone, heat alone, alternating between ice & heat.  Experiment to what works for you.  Swelling and bruising can take several weeks to resolve.   c. It is helpful to take an over-the-counter pain medication regularly for the first few weeks.  Choose one of the following that works best for you: i. Naproxen (Aleve, etc)  Two 220mg tabs twice a day ii. Ibuprofen (Advil, etc) Three 200mg tabs four times a day (every meal & bedtime) iii. Acetaminophen (Tylenol, etc) 325-650mg four times a day (every meal & bedtime) d. A  prescription for pain medication should be given to you upon discharge.  Take your pain medication as prescribed.  i. If you are having problems/concerns with the prescription medicine (does not control pain, nausea, vomiting, rash, itching, etc), please call us [phone] to see if we need to switch you to a different pain medicine that will work better for you and/or control your side effect better. ii. If you need a refill on your pain  medication, please contact your pharmacy.  They will contact our office to request authorization. Prescriptions will not be filled after 5 pm or on week-ends. 4. Avoid getting constipated.  Between the surgery and the pain medications, it is common to experience some constipation.  Increasing fluid intake and taking a fiber supplement (such as Metamucil, Citrucel, FiberCon, MiraLax, etc) 1-2 times a day regularly will usually help prevent this problem from occurring.  A mild laxative (prune juice, Milk of Magnesia, MiraLax, etc) should be taken according to package directions if there are no bowel movements after 48 hours.   5. Wash / shower every day.  You may shower over the dressings as they are waterproof.   6. Remove your waterproof bandages 5 days after surgery.  You may leave the incision open to air.  You may replace a dressing/Band-Aid to cover the incision for comfort if you wish.  Continue to shower over incision(s) after the dressing is off.    7. ACTIVITIES as tolerated:   a. You may resume regular (light) daily activities beginning the next day-such as daily self-care, walking, climbing stairs-gradually increasing activities as tolerated.  If you can walk 30 minutes without difficulty, it is safe to try more intense activity such as jogging, treadmill, bicycling, low-impact aerobics, swimming, etc. b. Save the most intensive and strenuous activity for last such as sit-ups, heavy lifting, contact sports, etc  Refrain from any heavy lifting or straining until you are off narcotics for pain control.     c. DO NOT PUSH THROUGH PAIN.  Let pain be your guide: If it hurts to do something, don't do it.  Pain is your body warning you to avoid that activity for another week until the pain goes down. d. You may drive when you are no longer taking prescription pain medication, you can comfortably wear a seatbelt, and you can safely maneuver your car and apply brakes. e. You may have sexual intercourse  when it is comfortable.  8. FOLLOW UP in our office a. Please call [facility] at [phone] to set up an appointment to see your surgeon in the office for a follow-up appointment approximately 2-3 weeks after your surgery. b. Make sure that you call for this appointment the day you arrive home to insure a convenient appointment time. 9.  IF YOU HAVE DISABILITY OR FAMILY LEAVE FORMS, BRING THEM TO THE OFFICE FOR PROCESSING.  DO NOT GIVE THEM TO YOUR DOCTOR.  WHEN TO CALL US [phone]: 1. Poor pain control 2. Reactions / problems with new medications (rash/itching, nausea, etc)  3. Fever over 101.5 F (38.5 C) 4. Inability to urinate 5. Nausea and/or vomiting 6. Worsening swelling or bruising 7. Continued bleeding from incision. 8. Increased pain, redness, or drainage from the incision   The clinic staff is available to answer your questions during regular business hours (8:30am-5pm).  Please don't hesitate to call and ask to speak to one of our nurses for clinical concerns.   If you have a medical emergency, go to the nearest emergency room or call 911.  A surgeon from [facility] is always on call at the hospitals in [place]  [facility] [address], [address], [place], [place]  [address] ?  [address] [address], [place], [place]   [address] MAIN: [phone] ? TOLL FREE: [phone] ? FAX: [phone] [email or web address]  Managing Pain  Pain after surgery or related to activity is often due to strain/injury to muscle, tendon, nerves and/or incisions.  This pain is usually short-term and will improve in a few months.   Many people find it helpful to do the following things TOGETHER to help speed the process of healing and to get back to regular activity more quickly:  1. Avoid heavy physical activity a.  no lifting greater than 20 pounds b. Do not "push through" the pain.  Listen to your body and avoid positions and maneuvers than  reproduce the pain c. Walking is okay as tolerated, but go slowly and stop when getting sore.  d. Remember: If it hurts to do it, then don't do it! 2. Take Anti-inflammatory medication  a. Take with food/snack around the clock for 1-2 weeks i. This helps the muscle and nerve tissues become less irritable and calm down faster b. Choose ONE of the following over-the-counter medications: i. Naproxen 220mg tabs (ex. Aleve) 1-2 pills twice a day  ii. Ibuprofen 200mg tabs (ex. Advil, Motrin) 3-4 pills with every meal and just before bedtime iii. Acetaminophen 500mg tabs (Tylenol) 1-2 pills with every meal and just before bedtime 3. Use a Heating pad or Ice/Cold Pack a. 4-6 times a day b. May use warm bath/hottub  or showers 4. Try Gentle Massage and/or Stretching  a. at the area of pain many times a day b. stop if you feel pain - do not overdo it  Try these steps together to help you body heal faster and avoid making things get worse.  Doing just one of these   things may not be enough.    If you are not getting better after two weeks or are noticing you are getting worse, contact our office for further advice; we may need to re-evaluate you & see what other things we can do to help.  Hernia A hernia occurs when an internal organ pushes out through a weak spot in the abdominal wall. Hernias most commonly occur in the groin and around the navel. Hernias often can be pushed back into place (reduced). Most hernias tend to get worse over time. Some abdominal hernias can get stuck in the opening (irreducible or incarcerated hernia) and cannot be reduced. An irreducible abdominal hernia which is tightly squeezed into the opening is at risk for impaired blood supply (strangulated hernia). A strangulated hernia is a medical emergency. Because of the risk for an irreducible or strangulated hernia, surgery may be recommended to repair a hernia. CAUSES   Heavy lifting.  Prolonged coughing.  Straining to  have a bowel movement.  A cut (incision) made during an abdominal surgery. HOME CARE INSTRUCTIONS   Bed rest is not required. You may continue your normal activities.  Avoid lifting more than 10 pounds (4.5 kg) or straining.  Cough gently. If you are a smoker it is best to stop. Even the best hernia repair can break down with the continual strain of coughing. Even if you do not have your hernia repaired, a cough will continue to aggravate the problem.  Do not wear anything tight over your hernia. Do not try to keep it in with an outside bandage or truss. These can damage abdominal contents if they are trapped within the hernia sac.  Eat a normal diet.  Avoid constipation. Straining over long periods of time will increase hernia size and encourage breakdown of repairs. If you cannot do this with diet alone, stool softeners may be used. SEEK IMMEDIATE MEDICAL CARE IF:   You have a fever.  You develop increasing abdominal pain.  You feel nauseous or vomit.  Your hernia is stuck outside the abdomen, looks discolored, feels hard, or is tender.  You have any changes in your bowel habits or in the hernia that are unusual for you.  You have increased pain or swelling around the hernia.  You cannot push the hernia back in place by applying gentle pressure while lying down. MAKE SURE YOU:   Understand these instructions.  Will watch your condition.  Will get help right away if you are not doing well or get worse. Document Released: 12/10/2005 Document Revised: 03/03/2012 Document Reviewed: 07/29/2008 Exit[employer] Patient Information 2014 [employer].  

## 2017-12-03 ENCOUNTER — Emergency Department (HOSPITAL_BASED_OUTPATIENT_CLINIC_OR_DEPARTMENT_OTHER)
Admission: EM | Admit: 2017-12-03 | Discharge: 2017-12-03 | Disposition: A | Discharge status: Home / Self Care | Payer: No Typology Code available for payment source | Attending: Emergency Medicine | Admitting: Emergency Medicine

## 2017-12-03 ENCOUNTER — Encounter (HOSPITAL_BASED_OUTPATIENT_CLINIC_OR_DEPARTMENT_OTHER): Payer: Self-pay | Admitting: *Deleted

## 2017-12-03 ENCOUNTER — Other Ambulatory Visit: Payer: Self-pay

## 2017-12-03 DIAGNOSIS — S0083XA Contusion of other part of head, initial encounter: Secondary | ICD-10-CM | POA: Insufficient documentation

## 2017-12-03 DIAGNOSIS — Y9301 Activity, walking, marching and hiking: Secondary | ICD-10-CM | POA: Insufficient documentation

## 2017-12-03 DIAGNOSIS — F17290 Nicotine dependence, other tobacco product, uncomplicated: Secondary | ICD-10-CM | POA: Insufficient documentation

## 2017-12-03 DIAGNOSIS — Y999 Unspecified external cause status: Secondary | ICD-10-CM | POA: Insufficient documentation

## 2017-12-03 DIAGNOSIS — W0110XA Fall on same level from slipping, tripping and stumbling with subsequent striking against unspecified object, initial encounter: Secondary | ICD-10-CM | POA: Insufficient documentation

## 2017-12-03 DIAGNOSIS — F0781 Postconcussional syndrome: Secondary | ICD-10-CM

## 2017-12-03 DIAGNOSIS — Y929 Unspecified place or not applicable: Secondary | ICD-10-CM | POA: Insufficient documentation

## 2017-12-03 DIAGNOSIS — F079 Unspecified personality and behavioral disorder due to known physiological condition: Secondary | ICD-10-CM | POA: Insufficient documentation

## 2017-12-03 MED ORDER — ONDANSETRON 4 MG PO TBDP
4.0000 mg | ORAL_TABLET | Freq: Once | ORAL | Status: AC
  Administered 2017-12-03: 4 mg via ORAL
  Filled 2017-12-03: qty 1

## 2017-12-03 MED ORDER — IBUPROFEN 800 MG PO TABS
800.0000 mg | ORAL_TABLET | Freq: Once | ORAL | Status: AC
Start: 2017-12-03 — End: 2017-12-03
  Administered 2017-12-03: 800 mg via ORAL
  Filled 2017-12-03: qty 1

## 2017-12-03 MED ORDER — ONDANSETRON 4 MG PO TBDP: 4.0000 mg | ORAL_TABLET | Freq: Three times a day (TID) | ORAL | 0 refills | Status: AC | PRN

## 2017-12-03 MED FILL — ONDANSETRON ODT 4 MG TABLET: 4 | 6 days supply | Qty: 20 | Fill #0

## 2017-12-03 NOTE — ED Triage Notes (Signed)
Pt c/o fall x 1 hr ago c/o h/a. No LOC

## 2017-12-03 NOTE — Discharge Instructions (Signed)
You may alternate Tylenol 1000 mg every 6 hours as needed for pain and Ibuprofen 800 mg every 8 hours as needed for pain.  Please take Ibuprofen with food.  Please avoid alcohol for the next week.  Please rest and drink plenty of water.  We recommend that you avoid any activity that may lead to another head injury for at least 1 week or until your symptoms have completely resolved.  We also recommend "brain rest" - please avoid TV, cell phones, tablets, computers as much as possible for the next 48 hours.     To find a primary care or specialty doctor please call [phone] or [phone] to access "[facility] Service."  You may also go on the [facility] website at [email or web address]  There are also multiple [facility], [facility], [facility] and [facility] practices throughout the [place] that are frequently accepting new patients. You may find a clinic that is close to your home and contact them.  [place] [address] [place]  [place] [address] [address] [place] [phone]
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
## 2018-01-24 ENCOUNTER — Emergency Department (HOSPITAL_COMMUNITY)
Admission: EM | Admit: 2018-01-24 | Discharge: 2018-01-24 | Discharge status: Against Medical Advice | Payer: No Typology Code available for payment source

## 2018-01-24 NOTE — ED Notes (Signed)
Pt no longer in the lobby

## 2018-01-24 NOTE — ED Triage Notes (Signed)
Pt called for triage, no answer

## 2018-01-25 ENCOUNTER — Emergency Department (HOSPITAL_COMMUNITY): Payer: Self-pay

## 2018-01-25 ENCOUNTER — Emergency Department (HOSPITAL_COMMUNITY)
Admission: EM | Admit: 2018-01-25 | Discharge: 2018-01-25 | Disposition: A | Discharge status: Home / Self Care | Payer: Self-pay | Attending: Emergency Medicine | Admitting: Emergency Medicine

## 2018-01-25 ENCOUNTER — Encounter (HOSPITAL_COMMUNITY): Payer: Self-pay | Admitting: Emergency Medicine

## 2018-01-25 DIAGNOSIS — G44329 Chronic post-traumatic headache, not intractable: Secondary | ICD-10-CM | POA: Insufficient documentation

## 2018-01-25 DIAGNOSIS — R51 Headache: Secondary | ICD-10-CM

## 2018-01-25 DIAGNOSIS — F17290 Nicotine dependence, other tobacco product, uncomplicated: Secondary | ICD-10-CM | POA: Insufficient documentation

## 2018-01-25 DIAGNOSIS — R519 Headache, unspecified: Secondary | ICD-10-CM

## 2018-01-25 DIAGNOSIS — F0781 Postconcussional syndrome: Secondary | ICD-10-CM | POA: Insufficient documentation

## 2018-01-25 MED ORDER — PROCHLORPERAZINE MALEATE 10 MG PO TABS: 10.0000 mg | ORAL_TABLET | Freq: Two times a day (BID) | ORAL | 0 refills | Status: AC | PRN

## 2018-01-25 MED ORDER — ACETAMINOPHEN 500 MG PO TABS
1000.0000 mg | ORAL_TABLET | Freq: Once | ORAL | Status: AC
  Administered 2018-01-25: 1000 mg via ORAL
  Filled 2018-01-25: qty 2

## 2018-01-25 NOTE — ED Triage Notes (Addendum)
Patient here from home with complaints of headache since falling and hitting head on ice 12/11. Hx of TBI and subdural hematoma. Ambulatory. AAO x4.

## 2018-01-25 NOTE — ED Notes (Signed)
Bed: WA25 Expected date:  Expected time:  Means of arrival:  Comments: Triage 2 
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
## 2018-07-25 IMAGING — CT CT HEAD W/O CM
3 series · 14 of 47 positions shown, 16 images · non-contrast
Comparison: None.

CLINICAL DATA: Headache, status post fall on [REDACTED], history
of traumatic brain injury with subdural hematoma

EXAM:
CT HEAD WITHOUT CONTRAST
TECHNIQUE: Contiguous axial images were obtained from the base of the skull
through the vertex without intravenous contrast.

[Series 2: head wo · axial · 0.47mm/px · z∈[+1292,+1422]mm · 8 of 32 slices shown, 10 images]
[im 3/32  brain]
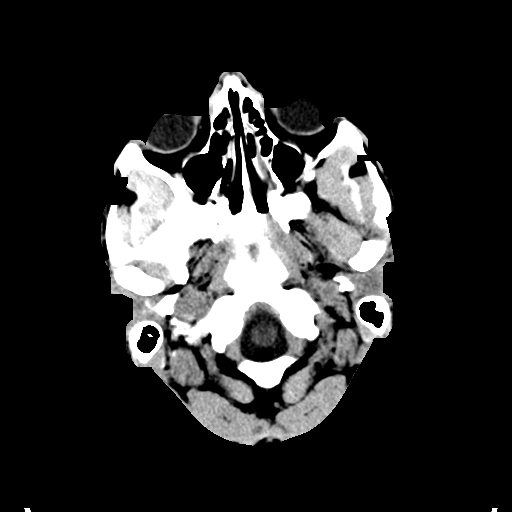
[im 3/32  bone]
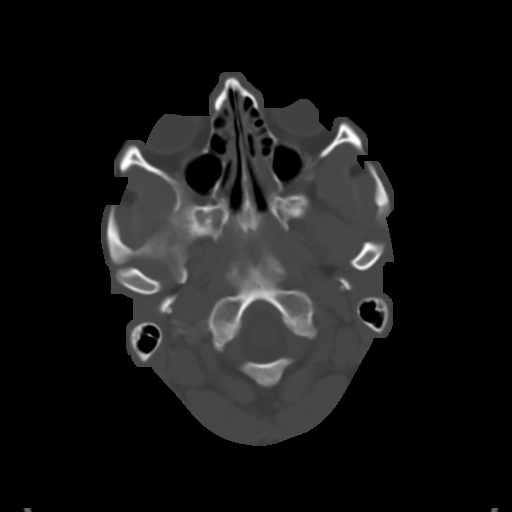
[im 7/32  brain]
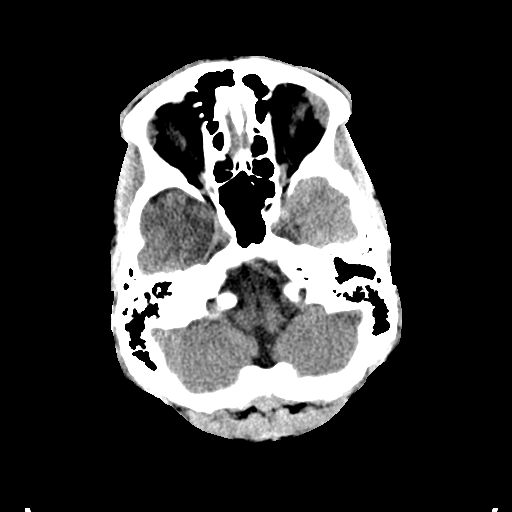
[im 10/32  brain]
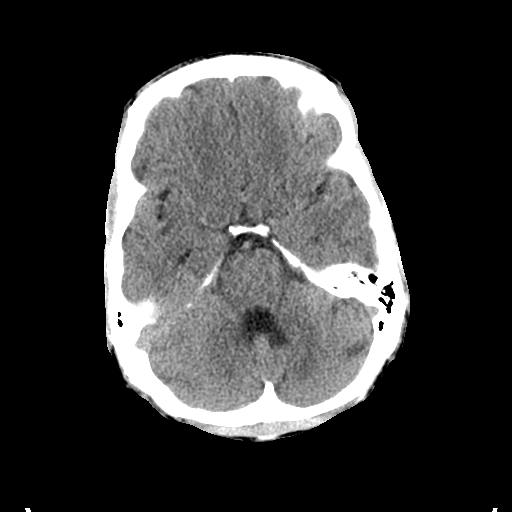
[im 14/32  brain]
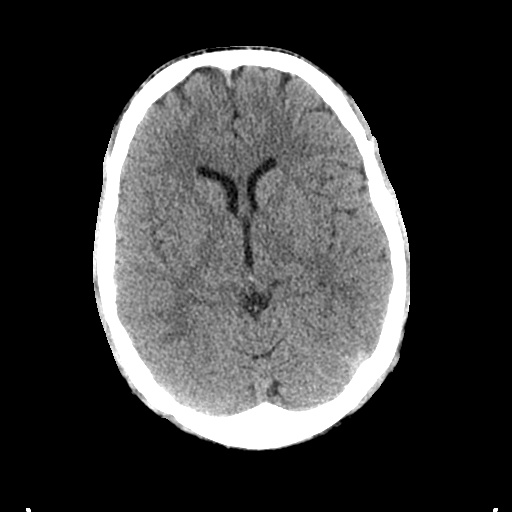
[im 18/32  brain]
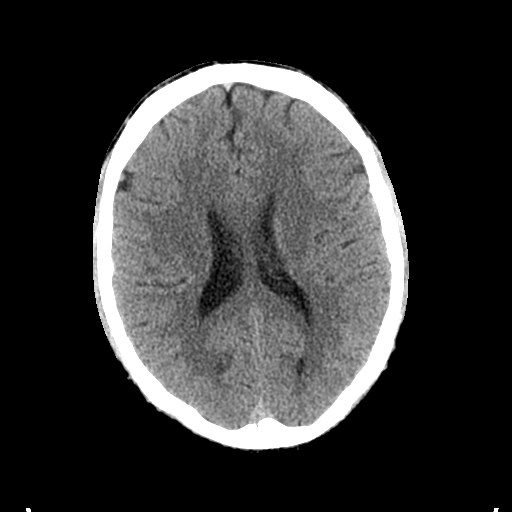
[im 18/32  bone]
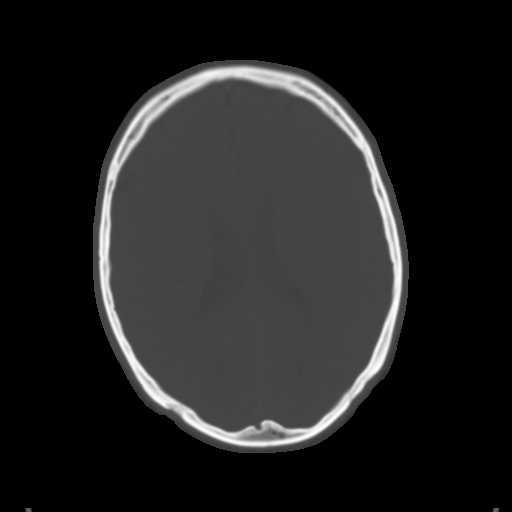
[im 22/32  brain]
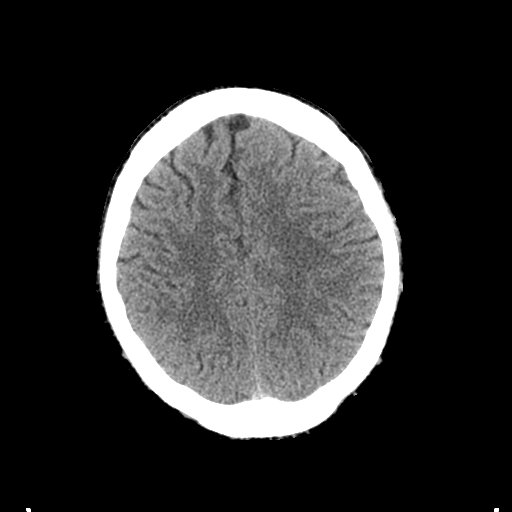
[im 25/32  brain]
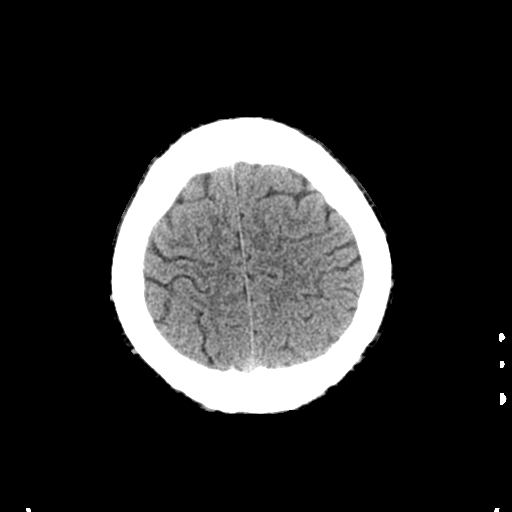
[im 29/32  brain]
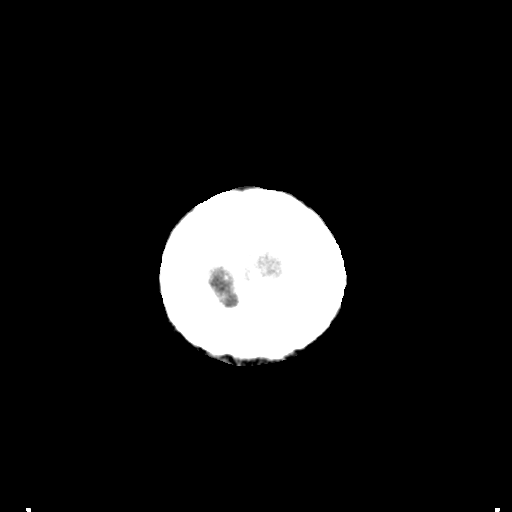

[Series 4: coronal soft tissue · coronal · 0.31mm/px · 3 of 78 slices shown]
[im 26/78  brain]
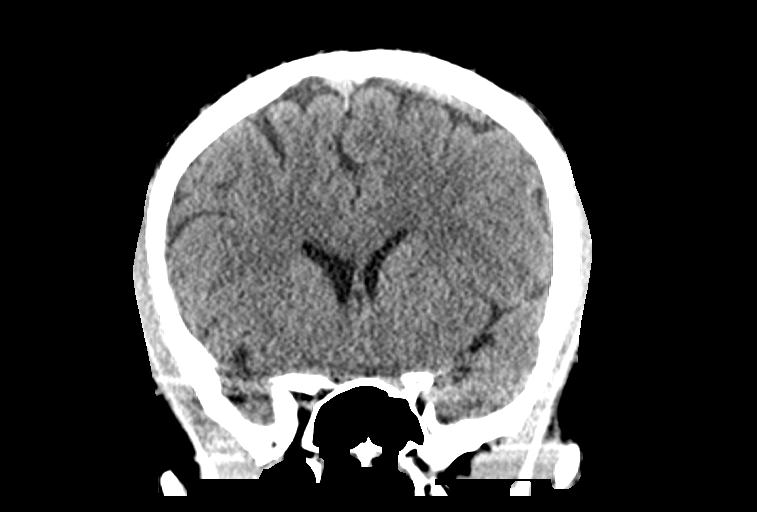
[im 35/78  brain]
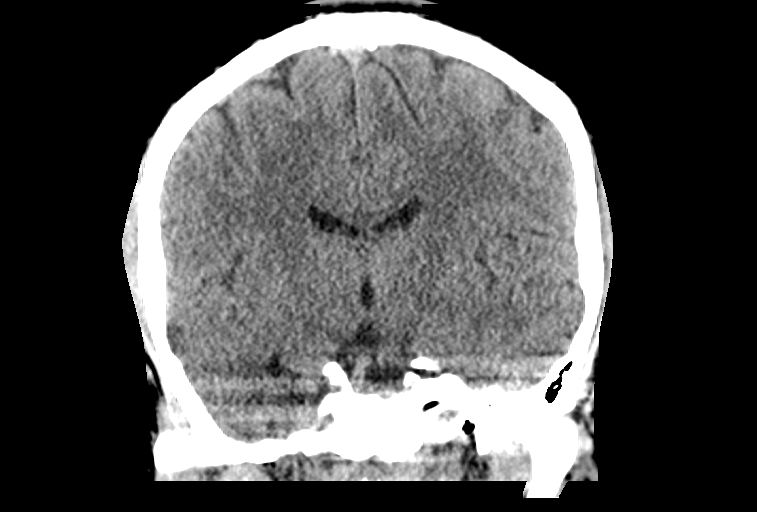
[im 43/78  brain]
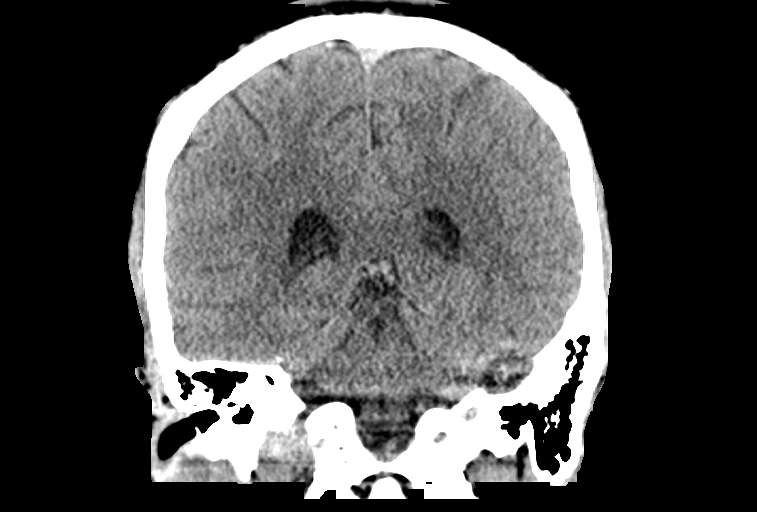

[Series 5: sagittal soft tissue · sagittal · 0.31mm/px · 3 of 76 slices shown]
[im 26/76  brain]
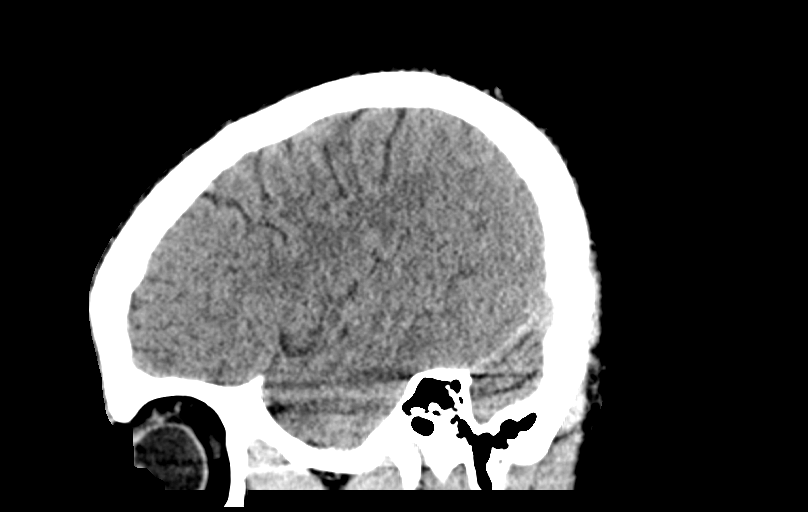
[im 38/76  brain]
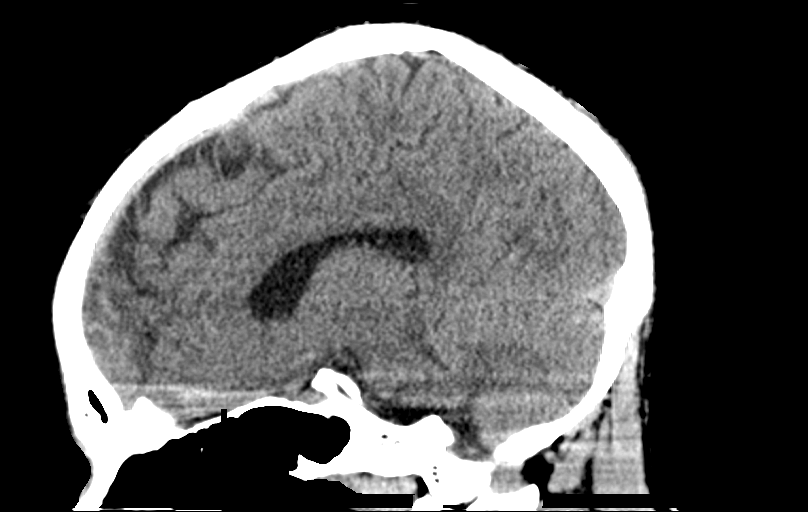
[im 51/76  brain]
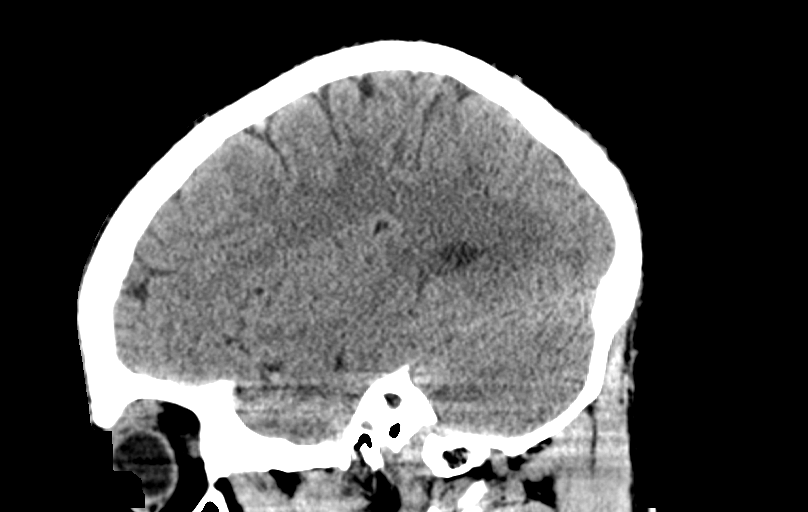

[14 of 47 positions shown; findings below may reference images not displayed]

FINDINGS: Brain: No evidence of acute infarction, hemorrhage, hydrocephalus,
extra-axial collection or mass lesion/mass effect.

Prior left subdural hematoma has resolved.

Vascular: No hyperdense vessel or unexpected calcification.

Skull: Healed left temporal and sphenoid fractures.

Sinuses/Orbits: The visualized paranasal sinuses are essentially
clear. The mastoid air cells are unopacified.

Other: None.
IMPRESSION: Normal head CT.

Prior left subdural hematoma has resolved.

Healed left temporal and sphenoid fractures.
